# Patient Record
Sex: Male | Born: 1945 | Race: White | Hispanic: No | Smoking: Never smoker
Health system: Southern US, Community
[De-identification: ages and names within clinical notes are randomized; demographics above are authoritative.]

---

## 2006-01-20 ENCOUNTER — Encounter: Admission: RE | Admit: 2006-01-20 | Discharge: 2006-01-20 | Payer: Self-pay | Admitting: Internal Medicine

## 2014-04-30 ENCOUNTER — Ambulatory Visit (INDEPENDENT_AMBULATORY_CARE_PROVIDER_SITE_OTHER): Payer: BC Managed Care – PPO

## 2014-04-30 ENCOUNTER — Ambulatory Visit (INDEPENDENT_AMBULATORY_CARE_PROVIDER_SITE_OTHER): Payer: BC Managed Care – PPO | Admitting: Family Medicine

## 2014-04-30 VITALS — BP 130/70 | HR 66 | Temp 97.7°F | Resp 16 | Ht 65.5 in | Wt 145.8 lb

## 2014-04-30 DIAGNOSIS — R0981 Nasal congestion: Secondary | ICD-10-CM

## 2014-04-30 DIAGNOSIS — R05 Cough: Secondary | ICD-10-CM

## 2014-04-30 DIAGNOSIS — R059 Cough, unspecified: Secondary | ICD-10-CM

## 2014-04-30 LAB — POCT CBC
GRANULOCYTE PERCENT: 65.4 % (ref 37–80)
HEMATOCRIT: 45.5 % (ref 43.5–53.7)
HEMOGLOBIN: 15 g/dL (ref 14.1–18.1)
Lymph, poc: 3.2 (ref 0.6–3.4)
MCH, POC: 32.3 pg — AB (ref 27–31.2)
MCHC: 33 g/dL (ref 31.8–35.4)
MCV: 97.9 fL — AB (ref 80–97)
MID (CBC): 0.6 (ref 0–0.9)
MPV: 8.1 fL (ref 0–99.8)
PLATELET COUNT, POC: 136 10*3/uL — AB (ref 142–424)
POC GRANULOCYTE: 7.3 — AB (ref 2–6.9)
POC LYMPH PERCENT: 28.9 %L (ref 10–50)
POC MID %: 5.7 %M (ref 0–12)
RBC: 4.65 M/uL — AB (ref 4.69–6.13)
RDW, POC: 13.5 %
WBC: 11.1 10*3/uL — AB (ref 4.6–10.2)

## 2014-04-30 MED ORDER — BENZONATATE 100 MG PO CAPS: 100.0000 mg | ORAL_CAPSULE | Freq: Three times a day (TID) | ORAL | Status: DC | PRN

## 2014-04-30 MED ORDER — AZITHROMYCIN 250 MG PO TABS: ORAL_TABLET | ORAL | Status: DC

## 2014-04-30 MED ORDER — HYDROCODONE-HOMATROPINE 5-1.5 MG/5ML PO SYRP: 5.0000 mL | ORAL_SOLUTION | Freq: Three times a day (TID) | ORAL | Status: DC | PRN

## 2014-04-30 NOTE — Progress Notes (Signed)
    IDENTIFYING INFORMATION  Patrick Baird / DOB: 1945/10/30 / MRN: [id number]  The patient  does not have a problem list on file.  SUBJECTIVE  CC: Cough; Nasal Congestion; Fever; Chills; and Generalized Body Aches   HPI: Patrick Baird is a 69 y.o. male presenting with four days of cough, fever, chills and generalized body aches. He has tried OTC Day Flu along with some acetaminphen with caffiene mild relief. He endorses fever early on in the illness, but reports this has resolved. He thinks his illness is getting worse and he "never goes to the doctor."     He denies SOB and fever at this time.    He  has no past medical history on file.    He currently has no medications in their medication list.  Patrick Baird has No Known Allergies. He  reports that he has never smoked. He does not have any smokeless tobacco history on file. He reports that he drinks alcohol. He reports that he does not use illicit drugs. He  has no sexual activity history on file.  The patient  has no past surgical history on file.  His family history is not on file.  Review of Systems  Constitutional: Positive for chills and malaise/fatigue. Negative for fever, weight loss and diaphoresis.  HENT: Negative for congestion and sore throat.   Respiratory: Positive for cough and sputum production. Negative for hemoptysis, shortness of breath and wheezing.   Musculoskeletal: Positive for myalgias.  Skin: Negative.   Neurological: Negative.  Negative for weakness and headaches.    OBJECTIVE  Blood pressure 130/70, pulse 66, temperature 97.7 F (36.5 C), temperature source Oral, resp. rate 16, height 5' 5.5" (1.664 m), weight 145 lb 12.8 oz (66.134 kg), SpO2 95 %. The patient's body mass index is 23.88 kg/(m^2).  Physical Exam  Constitutional: He is oriented to person, place, and time. He appears well-developed and well-nourished. No distress.  HENT:  Head: Normocephalic.  Mouth/Throat: No oropharyngeal exudate.   Neck: Normal range of motion. Neck supple.  Respiratory: Effort normal. No respiratory distress. He has no wheezes. He has rhonchi in the left middle field and the left lower field. He has no rales.  GI: Soft. Bowel sounds are normal.  Neurological: He is alert and oriented to person, place, and time. He has normal reflexes.  Skin: Skin is warm and dry. No rash noted. He is not diaphoretic. No erythema. No pallor.  Psychiatric: He has a normal mood and affect. His behavior is normal. Judgment and thought content normal.   No results found for this or any previous visit (from the past 24 hour(s)).  [facility] reading (PRIMARY) by  Dr. Conley RollsLe: Questionable left lower lobe infiltrate.    ASSESSMENT & PLAN  Patrick Baird was seen today for cough, nasal congestion, fever, chills and generalized body aches.  Diagnoses and associated orders for this visit:  Cough: Mile leukocytosis with left shift on CBC.  Xray questionable for left lower lobe infiltrate.   - CBC with Differential - DG Chest 2 View; Future -     Z-pack -     Hycodan   Sinus congestion:  -     Zyrtec     The patient was instructed to to call or comeback to clinic as needed, or should symptoms warrant.  Deliah BostonMichael Clark, MHS, PA-C [facility] 04/30/2014 6:26 PM

## 2014-07-12 ENCOUNTER — Ambulatory Visit (INDEPENDENT_AMBULATORY_CARE_PROVIDER_SITE_OTHER): Payer: BC Managed Care – PPO | Admitting: Family Medicine

## 2014-07-12 VITALS — BP 128/78 | HR 70 | Temp 97.4°F | Resp 16 | Ht 65.5 in | Wt 147.8 lb

## 2014-07-12 DIAGNOSIS — W57XXXA Bitten or stung by nonvenomous insect and other nonvenomous arthropods, initial encounter: Secondary | ICD-10-CM | POA: Diagnosis not present

## 2014-07-12 DIAGNOSIS — L237 Allergic contact dermatitis due to plants, except food: Secondary | ICD-10-CM

## 2014-07-12 MED ORDER — DOXYCYCLINE HYCLATE 100 MG PO TABS: ORAL_TABLET | ORAL | Status: AC

## 2014-07-12 NOTE — Patient Instructions (Signed)
Your poison ivy looks ok- continue calamine as needed We are going to give you a dose of doxycycline to help prevent lyme disease- take 200 mg once Let us know if you develop the spreading target rash (erythema migrans) or flu- like sx with fever, chills, body aches

## 2014-07-12 NOTE — Progress Notes (Signed)
[facility] [address], [place] [id number] [phone]  Date:  07/12/2014   Name:  Patrick Baird   DOB:  01-13-1946   MRN:  [id number]  PCP:  Enrique SackGREEN, EDWIN JAY, MD    Chief Complaint: Tick Bite and Rash   History of Present Illness:  Patrick Baird is a 69 y.o. very pleasant male patient who presents with the following:  He noted a tick on his right inner thigh about 3 weeks ago. He pulled the tick off, and the area of the bite got a "little bump." 5 or 6 days later he found another tick on his back, but it "was dead and brushed right off."  He also noted onset of PI on his arms about 2 weeks ago.   He has been treating this with calamine lotion and the PI seems to be under control The tick that bit him on his leg sounds like a dog tick, and it was engorged.  The other tick that he found on his back sounds more like a deer tick.   He has not noted any fever, chills or flu- like sx.   He is generally in good health Admits that he has been really worried about this tick bite, he is sometimes kept awake by fears of lyme disease.   There are no active problems to display for this patient.   History reviewed. No pertinent past medical history.  History reviewed. No pertinent past surgical history.  History  Substance Use Topics  . Smoking status: Never Smoker   . Smokeless tobacco: Not on file  . Alcohol Use: 0.0 oz/week    0 Standard drinks or equivalent per week    History reviewed. No pertinent family history.  No Known Allergies  Medication list has been reviewed and updated.  Current Outpatient Prescriptions on File Prior to Visit  Medication Sig Dispense Refill  . azithromycin (ZITHROMAX) 250 MG tablet Take 2 tabs PO x 1 dose, then 1 tab PO QD x 4 days (Patient not taking: Reported on 07/12/2014) 6 tablet 0  . benzonatate (TESSALON) 100 MG capsule Take 1-2 capsules (100-200 mg total) by mouth 3 (three) times daily as needed for cough. (Patient  not taking: Reported on 07/12/2014) 40 capsule 0  . HYDROcodone-homatropine (HYCODAN) 5-1.5 MG/5ML syrup Take 5 mLs by mouth every 8 (eight) hours as needed for cough. (Patient not taking: Reported on 07/12/2014) 120 mL 0   No current facility-administered medications on file prior to visit.    Review of Systems:  As per HPI- otherwise negative.   Physical Examination: Filed Vitals:   07/12/14 0859  BP: 128/78  Pulse: 70  Temp: 97.4 F (36.3 C)  Resp: 16   Filed Vitals:   07/12/14 0859  Height: 5' 5.5" (1.664 m)  Weight: 147 lb 12.8 oz (67.042 kg)   Body mass index is 24.21 kg/(m^2). Ideal Body Weight: Weight in (lb) to have BMI = 25: 152.2  GEN: WDWN, NAD, Non-toxic, A & O x 3, looks well HEENT: Atraumatic, Normocephalic. Neck supple. No masses, No LAD.  No oral lesions Ears and Nose: No external deformity. CV: RRR, No M/G/R. No JVD. No thrill. No extra heart sounds. PULM: CTA B, no wheezes, crackles, rhonchi. No retractions. No resp. distress. No accessory muscle use. EXTR: No c/c/e NEURO Normal gait.  PSYCH: Normally interactive. Conversant. Not depressed or anxious appearing.  Calm demeanor.  Very mild PI rash on bilateral arms, clearing up on  it's own Healing area right inner thigh with mild post- inflam hyperpigmentation Right lower back shows small area of induration from recent tick bite but no erythema migrans.    Assessment and Plan: Tick bite - Plan: doxycycline (VIBRA-TABS) 100 MG tablet  Poison ivy  PI is healing and doing ok, reassurance Advised that lyme prophylaxis is not generally indicated in this area of the country, but he is really worried about this and would like to take a dose of doxycycline.  The prophylactic dose of doxycycline is  once.  Advised him to watch for any sign of erythma migrans or flu- like sx  Signed Abbe Amsterdam, MD

## 2016-06-08 IMAGING — CR DG CHEST 2V
2 series · 2 of 2 positions shown · non-contrast
Comparison: None.

CLINICAL DATA: Cough.

EXAM:
CHEST  2 VIEW

[PA]
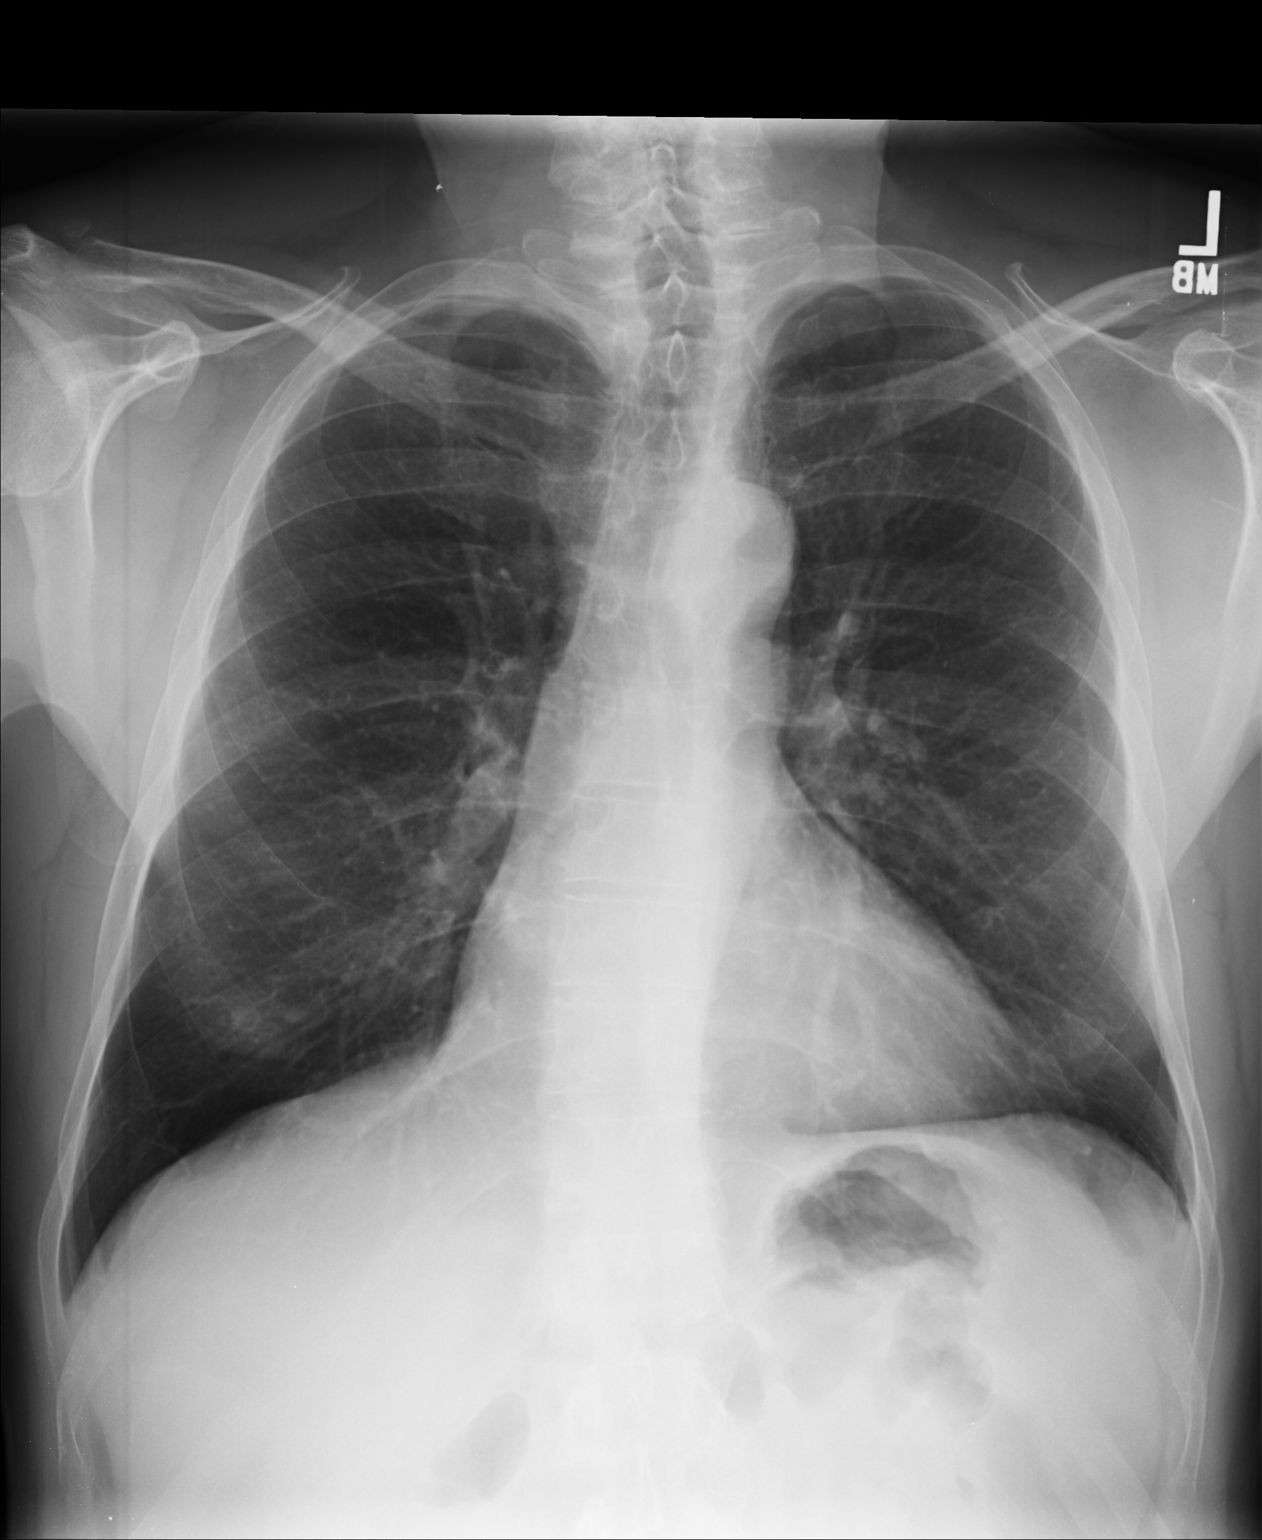

[lateral]
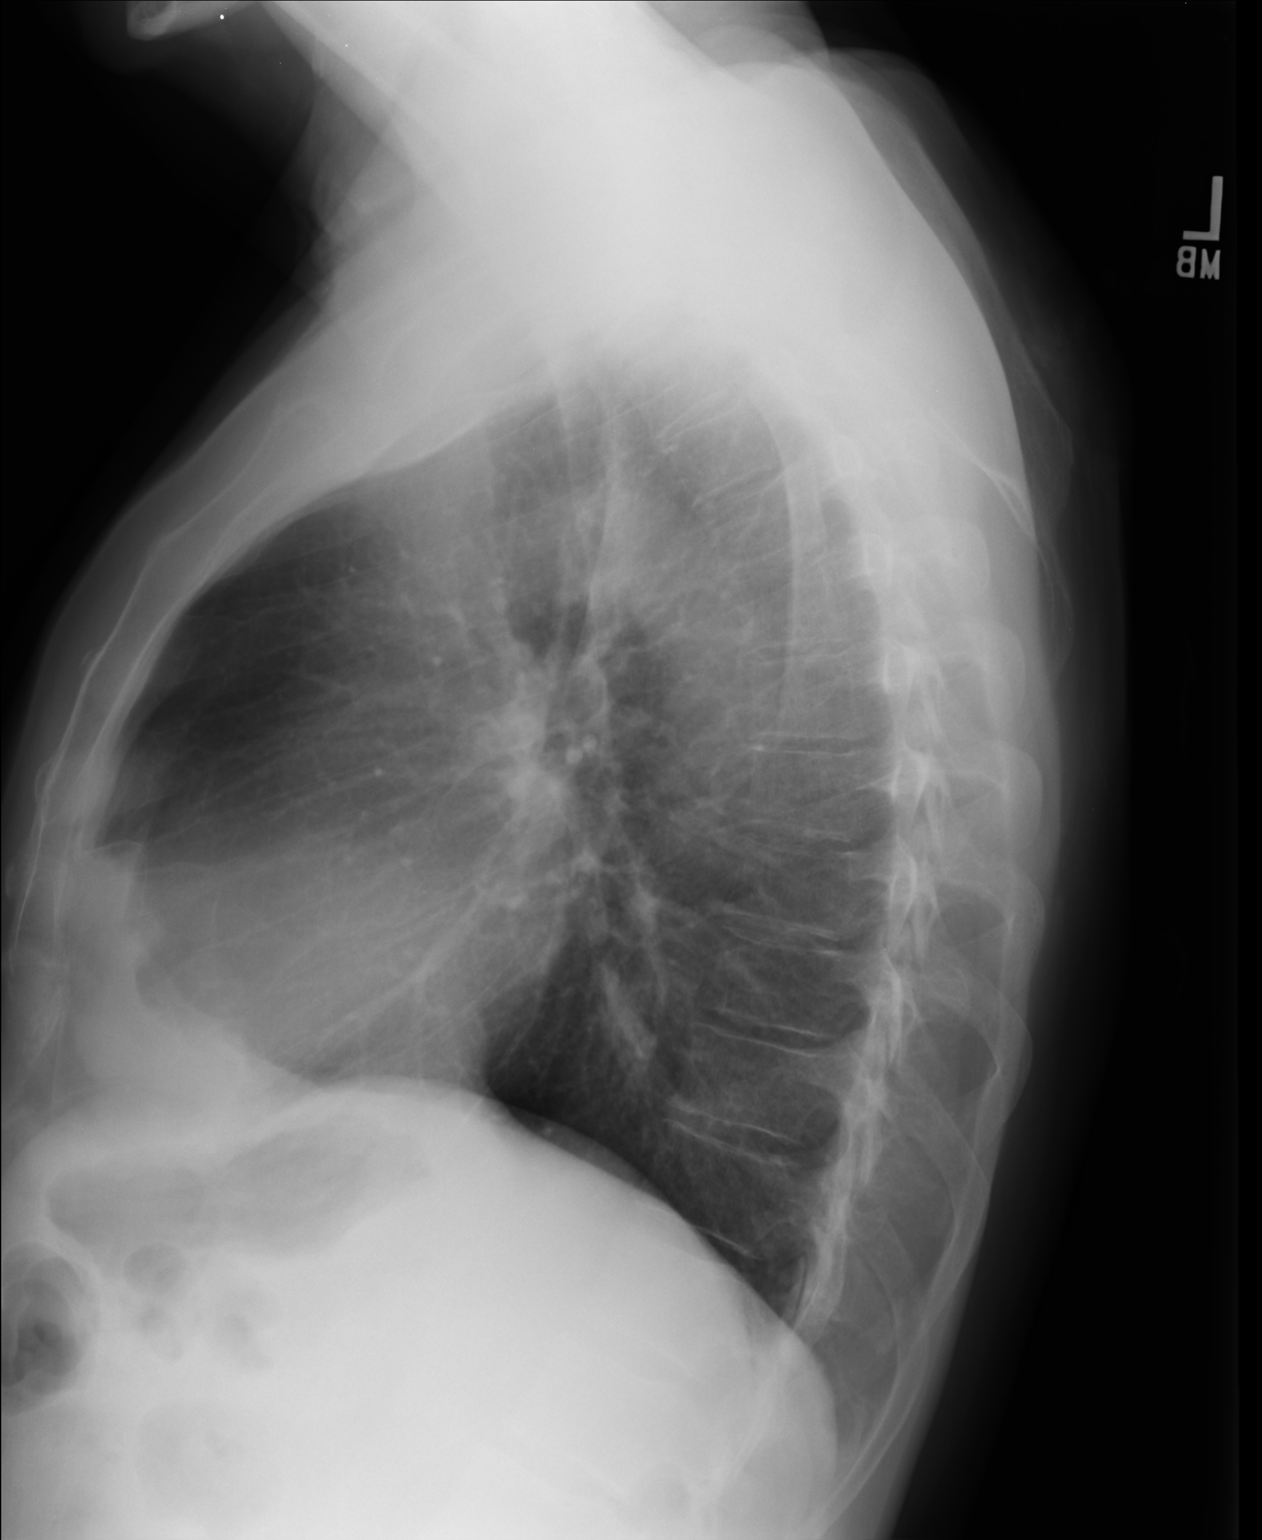

[2 of 2 positions shown; findings below may reference images not displayed]

FINDINGS: The heart size and mediastinal contours are within normal limits.
Both lungs are clear. The visualized skeletal structures are
unremarkable.
IMPRESSION: No active cardiopulmonary disease.

## 2019-05-11 ENCOUNTER — Ambulatory Visit: Payer: Self-pay

## 2019-05-17 ENCOUNTER — Ambulatory Visit: Payer: BC Managed Care – PPO | Attending: Internal Medicine

## 2019-05-17 DIAGNOSIS — Z23 Encounter for immunization: Secondary | ICD-10-CM | POA: Insufficient documentation

## 2019-05-17 NOTE — Progress Notes (Signed)
   Covid-19 Vaccination Clinic  Name:  Sione Baumgarten    MRN: [id number] DOB: 04-26-1945  05/17/2019  Mr. Harrower was observed post Covid-19 immunization for 15 minutes without incidence. He was provided with Vaccine Information Sheet and instruction to access the V-Safe system.   Mr. Kawecki was instructed to call 911 with any severe reactions post vaccine: Marland Kitchen Difficulty breathing  . Swelling of your face and throat  . A fast heartbeat  . A bad rash all over your body  . Dizziness and weakness    Immunizations Administered    Name Date Dose VIS Date Route   Pfizer COVID-19 Vaccine 05/17/2019  5:51 PM 0.3 mL 03/23/2019 Intramuscular   Manufacturer: [employer], [employer]   Lot: [id number]   NDC: [address]-9

## 2019-05-28 ENCOUNTER — Ambulatory Visit: Payer: Self-pay

## 2019-06-12 ENCOUNTER — Ambulatory Visit: Payer: BC Managed Care – PPO | Attending: Internal Medicine

## 2019-06-12 DIAGNOSIS — Z23 Encounter for immunization: Secondary | ICD-10-CM | POA: Insufficient documentation

## 2019-06-12 NOTE — Progress Notes (Signed)
   Covid-19 Vaccination Clinic  Name:  Angelo Prindle    MRN: [id number] DOB: [birth year]  06/12/2019  Mr. Werling was observed post Covid-19 immunization for 15 minutes without incident. He was provided with Vaccine Information Sheet and instruction to access the V-Safe system.   Mr. Rawlinson was instructed to call 911 with any severe reactions post vaccine: Marland Kitchen Difficulty breathing  . Swelling of face and throat  . A fast heartbeat  . A bad rash all over body  . Dizziness and weakness   Immunizations Administered    Name Date Dose VIS Date Route   Pfizer COVID-19 Vaccine 06/12/2019  2:58 PM 0.3 mL 03/23/2019 Intramuscular   Manufacturer: [employer], [employer]   Lot: [id number]   NDC: [address]-4

## 2021-09-01 DIAGNOSIS — M810 Age-related osteoporosis without current pathological fracture: Secondary | ICD-10-CM | POA: Diagnosis not present

## 2021-09-29 DIAGNOSIS — G43109 Migraine with aura, not intractable, without status migrainosus: Secondary | ICD-10-CM | POA: Diagnosis not present

## 2021-09-29 DIAGNOSIS — N400 Enlarged prostate without lower urinary tract symptoms: Secondary | ICD-10-CM | POA: Diagnosis not present

## 2021-09-29 DIAGNOSIS — M810 Age-related osteoporosis without current pathological fracture: Secondary | ICD-10-CM | POA: Diagnosis not present

## 2021-09-29 DIAGNOSIS — J302 Other seasonal allergic rhinitis: Secondary | ICD-10-CM | POA: Diagnosis not present

## 2021-09-29 DIAGNOSIS — L84 Corns and callosities: Secondary | ICD-10-CM | POA: Diagnosis not present

## 2021-10-14 ENCOUNTER — Ambulatory Visit: Payer: Medicare PPO | Admitting: Podiatry

## 2021-10-14 DIAGNOSIS — Q828 Other specified congenital malformations of skin: Secondary | ICD-10-CM

## 2021-10-16 ENCOUNTER — Encounter: Payer: Self-pay | Admitting: Podiatry

## 2021-10-16 NOTE — Progress Notes (Signed)
  Subjective:  Patient ID: Patrick Baird, male    DOB: 04/27/1945,  MRN: [id number]  Chief Complaint  Patient presents with   Callouses     CORN/CALLUS    76 y.o. male presents with the above complaint.  Patient presents with complaint of right submetatarsal 5 porokeratotic lesion.  Patient states pain for touch is progressive gotten worse.  He wanted get it evaluated he is not a diabetic.  Is painful when applying pressure.  He has not seen anyone else prior to seeing me is tried some shoe gear modification which has not helped.  Does not want to get orthotics.  Hurts with ambulation.   Review of Systems: Negative except as noted in the HPI. Denies N/V/F/Ch.  No past medical history on file.  Current Outpatient Medications:    doxycycline (VIBRA-TABS) 100 MG tablet, Take 2 tablets (200mg ) once, Disp: 2 tablet, Rfl: 0   fexofenadine (ALLEGRA) 30 MG tablet, Take 30 mg by mouth 2 (two) times daily., Disp: , Rfl:   Social History   Tobacco Use  Smoking Status Never  Smokeless Tobacco Not on file    No Known Allergies Objective:  There were no vitals filed for this visit. There is no height or weight on file to calculate BMI. Constitutional Well developed. Well nourished.  Vascular Dorsalis pedis pulses palpable bilaterally. Posterior tibial pulses palpable bilaterally. Capillary refill normal to all digits.  No cyanosis or clubbing noted. Pedal hair growth normal.  Neurologic Normal speech. Oriented to person, place, and time. Epicritic sensation to light touch grossly present bilaterally.  Dermatologic Hyperkeratotic lesion with central nucleated core noted to right submetatarsal 5.  Pain on palpation to the lesion.  Orthopedic: Normal joint ROM without pain or crepitus bilaterally. No visible deformities. No bony tenderness.   Radiographs: None Assessment:   1. Porokeratosis    Plan:  Patient was evaluated and treated and all questions answered.  Right  submetatarsal 5 porokeratosis -All questions and concerns were discussed with the patient in extensive detail.  Given the amount of pain that he is having a benefit from debridement of the lesion using chisel blade handle lesion was debrided down to healthy scar tissue.  No complication noted no pinpoint bleeding noted.  No follow-ups on file.

## 2022-03-31 DIAGNOSIS — Z125 Encounter for screening for malignant neoplasm of prostate: Secondary | ICD-10-CM | POA: Diagnosis not present

## 2022-03-31 DIAGNOSIS — Z79899 Other long term (current) drug therapy: Secondary | ICD-10-CM | POA: Diagnosis not present

## 2022-03-31 DIAGNOSIS — M810 Age-related osteoporosis without current pathological fracture: Secondary | ICD-10-CM | POA: Diagnosis not present

## 2022-03-31 DIAGNOSIS — N400 Enlarged prostate without lower urinary tract symptoms: Secondary | ICD-10-CM | POA: Diagnosis not present

## 2022-03-31 DIAGNOSIS — R7989 Other specified abnormal findings of blood chemistry: Secondary | ICD-10-CM | POA: Diagnosis not present

## 2022-04-07 DIAGNOSIS — R972 Elevated prostate specific antigen [PSA]: Secondary | ICD-10-CM | POA: Diagnosis not present

## 2022-04-07 DIAGNOSIS — Z1389 Encounter for screening for other disorder: Secondary | ICD-10-CM | POA: Diagnosis not present

## 2022-04-07 DIAGNOSIS — R3129 Other microscopic hematuria: Secondary | ICD-10-CM | POA: Diagnosis not present

## 2022-04-07 DIAGNOSIS — Z1331 Encounter for screening for depression: Secondary | ICD-10-CM | POA: Diagnosis not present

## 2022-04-07 DIAGNOSIS — G43109 Migraine with aura, not intractable, without status migrainosus: Secondary | ICD-10-CM | POA: Diagnosis not present

## 2022-04-07 DIAGNOSIS — N400 Enlarged prostate without lower urinary tract symptoms: Secondary | ICD-10-CM | POA: Diagnosis not present

## 2022-04-07 DIAGNOSIS — N1831 Chronic kidney disease, stage 3a: Secondary | ICD-10-CM | POA: Diagnosis not present

## 2022-04-07 DIAGNOSIS — M810 Age-related osteoporosis without current pathological fracture: Secondary | ICD-10-CM | POA: Diagnosis not present

## 2022-04-07 DIAGNOSIS — R3121 Asymptomatic microscopic hematuria: Secondary | ICD-10-CM | POA: Diagnosis not present

## 2022-04-07 DIAGNOSIS — Z0000 Encounter for general adult medical examination without abnormal findings: Secondary | ICD-10-CM | POA: Diagnosis not present

## 2022-04-07 DIAGNOSIS — Z23 Encounter for immunization: Secondary | ICD-10-CM | POA: Diagnosis not present

## 2022-07-21 DIAGNOSIS — D123 Benign neoplasm of transverse colon: Secondary | ICD-10-CM | POA: Diagnosis not present

## 2022-07-21 DIAGNOSIS — D120 Benign neoplasm of cecum: Secondary | ICD-10-CM | POA: Diagnosis not present

## 2022-07-21 DIAGNOSIS — K5730 Diverticulosis of large intestine without perforation or abscess without bleeding: Secondary | ICD-10-CM | POA: Diagnosis not present

## 2022-07-21 DIAGNOSIS — K649 Unspecified hemorrhoids: Secondary | ICD-10-CM | POA: Diagnosis not present

## 2022-07-21 DIAGNOSIS — Z09 Encounter for follow-up examination after completed treatment for conditions other than malignant neoplasm: Secondary | ICD-10-CM | POA: Diagnosis not present

## 2022-07-21 DIAGNOSIS — Z86010 Personal history of colonic polyps: Secondary | ICD-10-CM | POA: Diagnosis not present

## 2022-07-23 DIAGNOSIS — D123 Benign neoplasm of transverse colon: Secondary | ICD-10-CM | POA: Diagnosis not present

## 2022-07-28 DIAGNOSIS — T63481A Toxic effect of venom of other arthropod, accidental (unintentional), initial encounter: Secondary | ICD-10-CM | POA: Diagnosis not present

## 2022-07-28 DIAGNOSIS — S60463A Insect bite (nonvenomous) of left middle finger, initial encounter: Secondary | ICD-10-CM | POA: Diagnosis not present

## 2022-07-28 DIAGNOSIS — W57XXXA Bitten or stung by nonvenomous insect and other nonvenomous arthropods, initial encounter: Secondary | ICD-10-CM | POA: Diagnosis not present

## 2022-10-06 DIAGNOSIS — N1831 Chronic kidney disease, stage 3a: Secondary | ICD-10-CM | POA: Diagnosis not present

## 2022-10-06 DIAGNOSIS — R972 Elevated prostate specific antigen [PSA]: Secondary | ICD-10-CM | POA: Diagnosis not present

## 2022-10-06 DIAGNOSIS — M810 Age-related osteoporosis without current pathological fracture: Secondary | ICD-10-CM | POA: Diagnosis not present

## 2022-10-06 DIAGNOSIS — G43109 Migraine with aura, not intractable, without status migrainosus: Secondary | ICD-10-CM | POA: Diagnosis not present

## 2022-10-06 DIAGNOSIS — J302 Other seasonal allergic rhinitis: Secondary | ICD-10-CM | POA: Diagnosis not present

## 2022-10-06 DIAGNOSIS — N400 Enlarged prostate without lower urinary tract symptoms: Secondary | ICD-10-CM | POA: Diagnosis not present

## 2023-04-01 DIAGNOSIS — H5213 Myopia, bilateral: Secondary | ICD-10-CM | POA: Diagnosis not present

## 2023-04-15 DIAGNOSIS — N400 Enlarged prostate without lower urinary tract symptoms: Secondary | ICD-10-CM | POA: Diagnosis not present

## 2023-04-15 DIAGNOSIS — R7989 Other specified abnormal findings of blood chemistry: Secondary | ICD-10-CM | POA: Diagnosis not present

## 2023-04-15 DIAGNOSIS — M810 Age-related osteoporosis without current pathological fracture: Secondary | ICD-10-CM | POA: Diagnosis not present

## 2023-04-15 DIAGNOSIS — N1831 Chronic kidney disease, stage 3a: Secondary | ICD-10-CM | POA: Diagnosis not present

## 2023-04-15 DIAGNOSIS — R972 Elevated prostate specific antigen [PSA]: Secondary | ICD-10-CM | POA: Diagnosis not present

## 2023-04-20 DIAGNOSIS — Z1331 Encounter for screening for depression: Secondary | ICD-10-CM | POA: Diagnosis not present

## 2023-04-20 DIAGNOSIS — R21 Rash and other nonspecific skin eruption: Secondary | ICD-10-CM | POA: Diagnosis not present

## 2023-04-20 DIAGNOSIS — Z0000 Encounter for general adult medical examination without abnormal findings: Secondary | ICD-10-CM | POA: Diagnosis not present

## 2023-04-20 DIAGNOSIS — R310 Gross hematuria: Secondary | ICD-10-CM | POA: Diagnosis not present

## 2023-04-20 DIAGNOSIS — N1831 Chronic kidney disease, stage 3a: Secondary | ICD-10-CM | POA: Diagnosis not present

## 2023-04-20 DIAGNOSIS — Z1339 Encounter for screening examination for other mental health and behavioral disorders: Secondary | ICD-10-CM | POA: Diagnosis not present

## 2023-04-20 DIAGNOSIS — G43109 Migraine with aura, not intractable, without status migrainosus: Secondary | ICD-10-CM | POA: Diagnosis not present

## 2023-04-20 DIAGNOSIS — R3121 Asymptomatic microscopic hematuria: Secondary | ICD-10-CM | POA: Diagnosis not present

## 2023-04-20 DIAGNOSIS — R972 Elevated prostate specific antigen [PSA]: Secondary | ICD-10-CM | POA: Diagnosis not present

## 2023-04-20 DIAGNOSIS — M810 Age-related osteoporosis without current pathological fracture: Secondary | ICD-10-CM | POA: Diagnosis not present

## 2023-06-24 DIAGNOSIS — H18413 Arcus senilis, bilateral: Secondary | ICD-10-CM | POA: Diagnosis not present

## 2023-06-24 DIAGNOSIS — H25013 Cortical age-related cataract, bilateral: Secondary | ICD-10-CM | POA: Diagnosis not present

## 2023-06-24 DIAGNOSIS — H2511 Age-related nuclear cataract, right eye: Secondary | ICD-10-CM | POA: Diagnosis not present

## 2023-06-24 DIAGNOSIS — H2513 Age-related nuclear cataract, bilateral: Secondary | ICD-10-CM | POA: Diagnosis not present

## 2023-06-24 DIAGNOSIS — H25043 Posterior subcapsular polar age-related cataract, bilateral: Secondary | ICD-10-CM | POA: Diagnosis not present

## 2023-07-11 DIAGNOSIS — H2511 Age-related nuclear cataract, right eye: Secondary | ICD-10-CM | POA: Diagnosis not present

## 2023-07-12 DIAGNOSIS — H25042 Posterior subcapsular polar age-related cataract, left eye: Secondary | ICD-10-CM | POA: Diagnosis not present

## 2023-07-12 DIAGNOSIS — H25012 Cortical age-related cataract, left eye: Secondary | ICD-10-CM | POA: Diagnosis not present

## 2023-07-12 DIAGNOSIS — H2512 Age-related nuclear cataract, left eye: Secondary | ICD-10-CM | POA: Diagnosis not present

## 2023-07-25 DIAGNOSIS — H2512 Age-related nuclear cataract, left eye: Secondary | ICD-10-CM | POA: Diagnosis not present

## 2023-10-21 DIAGNOSIS — N1831 Chronic kidney disease, stage 3a: Secondary | ICD-10-CM | POA: Diagnosis not present

## 2023-10-21 DIAGNOSIS — M778 Other enthesopathies, not elsewhere classified: Secondary | ICD-10-CM | POA: Diagnosis not present

## 2023-10-21 DIAGNOSIS — R3121 Asymptomatic microscopic hematuria: Secondary | ICD-10-CM | POA: Diagnosis not present

## 2023-10-21 DIAGNOSIS — M810 Age-related osteoporosis without current pathological fracture: Secondary | ICD-10-CM | POA: Diagnosis not present

## 2023-10-21 DIAGNOSIS — N400 Enlarged prostate without lower urinary tract symptoms: Secondary | ICD-10-CM | POA: Diagnosis not present

## 2023-10-21 DIAGNOSIS — R972 Elevated prostate specific antigen [PSA]: Secondary | ICD-10-CM | POA: Diagnosis not present

## 2023-10-21 DIAGNOSIS — G43109 Migraine with aura, not intractable, without status migrainosus: Secondary | ICD-10-CM | POA: Diagnosis not present

## 2023-10-21 DIAGNOSIS — R21 Rash and other nonspecific skin eruption: Secondary | ICD-10-CM | POA: Diagnosis not present

## 2023-10-21 DIAGNOSIS — N390 Urinary tract infection, site not specified: Secondary | ICD-10-CM | POA: Diagnosis not present

## 2023-10-21 DIAGNOSIS — Z23 Encounter for immunization: Secondary | ICD-10-CM | POA: Diagnosis not present

## 2023-11-10 DIAGNOSIS — M810 Age-related osteoporosis without current pathological fracture: Secondary | ICD-10-CM | POA: Diagnosis not present
